# Patient Record
Sex: Female | Born: 1975 | Race: Black or African American | Hispanic: No | Smoking: Current every day smoker
Health system: Southern US, Community
[De-identification: ages and names within clinical notes are randomized; demographics above are authoritative.]

## PROBLEM LIST (undated history)

## (undated) DIAGNOSIS — G43909 Migraine, unspecified, not intractable, without status migrainosus: Secondary | ICD-10-CM

## (undated) DIAGNOSIS — I10 Essential (primary) hypertension: Secondary | ICD-10-CM

## (undated) HISTORY — PX: CHOLECYSTECTOMY: SHX55

## (undated) HISTORY — PX: ABDOMINAL HYSTERECTOMY: SHX81

## (undated) HISTORY — PX: BACK SURGERY: SHX140

## (undated) HISTORY — PX: KNEE SURGERY: SHX244

---

## 2000-06-09 ENCOUNTER — Ambulatory Visit (HOSPITAL_COMMUNITY): Admission: RE | Admit: 2000-06-09 | Discharge: 2000-06-09 | Payer: Self-pay | Admitting: General Surgery

## 2002-02-15 ENCOUNTER — Inpatient Hospital Stay (HOSPITAL_COMMUNITY): Admission: RE | Admit: 2002-02-15 | Discharge: 2002-02-18 | Payer: Self-pay | Admitting: Orthopaedic Surgery

## 2002-02-15 ENCOUNTER — Encounter: Payer: Self-pay | Admitting: Orthopaedic Surgery

## 2005-12-26 LAB — CONVERTED CEMR LAB: Pap Smear: NORMAL

## 2006-04-29 ENCOUNTER — Ambulatory Visit: Payer: Self-pay | Admitting: Internal Medicine

## 2006-05-06 ENCOUNTER — Ambulatory Visit (HOSPITAL_COMMUNITY): Admission: RE | Admit: 2006-05-06 | Discharge: 2006-05-06 | Payer: Self-pay | Admitting: Internal Medicine

## 2006-05-06 ENCOUNTER — Ambulatory Visit: Payer: Self-pay | Admitting: Internal Medicine

## 2006-05-06 ENCOUNTER — Encounter (INDEPENDENT_AMBULATORY_CARE_PROVIDER_SITE_OTHER): Payer: Self-pay | Admitting: Specialist

## 2006-07-15 ENCOUNTER — Ambulatory Visit: Payer: Self-pay | Admitting: Gastroenterology

## 2007-08-17 ENCOUNTER — Encounter: Admission: RE | Admit: 2007-08-17 | Discharge: 2007-08-17 | Payer: Self-pay | Admitting: Orthopaedic Surgery

## 2007-12-05 ENCOUNTER — Ambulatory Visit: Payer: Self-pay | Admitting: Internal Medicine

## 2007-12-05 DIAGNOSIS — N390 Urinary tract infection, site not specified: Secondary | ICD-10-CM | POA: Insufficient documentation

## 2007-12-05 DIAGNOSIS — K219 Gastro-esophageal reflux disease without esophagitis: Secondary | ICD-10-CM | POA: Insufficient documentation

## 2007-12-05 DIAGNOSIS — M545 Low back pain, unspecified: Secondary | ICD-10-CM | POA: Insufficient documentation

## 2007-12-05 DIAGNOSIS — K5900 Constipation, unspecified: Secondary | ICD-10-CM | POA: Insufficient documentation

## 2007-12-05 DIAGNOSIS — K589 Irritable bowel syndrome without diarrhea: Secondary | ICD-10-CM | POA: Insufficient documentation

## 2007-12-05 DIAGNOSIS — Z87898 Personal history of other specified conditions: Secondary | ICD-10-CM | POA: Insufficient documentation

## 2007-12-05 DIAGNOSIS — F17200 Nicotine dependence, unspecified, uncomplicated: Secondary | ICD-10-CM | POA: Insufficient documentation

## 2007-12-05 DIAGNOSIS — J309 Allergic rhinitis, unspecified: Secondary | ICD-10-CM | POA: Insufficient documentation

## 2007-12-05 DIAGNOSIS — Z8669 Personal history of other diseases of the nervous system and sense organs: Secondary | ICD-10-CM | POA: Insufficient documentation

## 2007-12-05 DIAGNOSIS — D649 Anemia, unspecified: Secondary | ICD-10-CM | POA: Insufficient documentation

## 2007-12-06 LAB — CONVERTED CEMR LAB
Eosinophils Relative: 2 % (ref 0–5)
Glucose, Bld: 76 mg/dL (ref 70–99)
HCT: 37.7 % (ref 36.0–46.0)
Hemoglobin: 12.1 g/dL (ref 12.0–15.0)
Lymphocytes Relative: 35 % (ref 12–46)
Lymphs Abs: 1.6 10*3/uL (ref 0.7–4.0)
Monocytes Absolute: 0.5 10*3/uL (ref 0.1–1.0)
RBC: 4.16 M/uL (ref 3.87–5.11)
Total CHOL/HDL Ratio: 2.6
VLDL: 9 mg/dL (ref 0–40)
WBC: 4.4 10*3/uL (ref 4.0–10.5)

## 2007-12-20 ENCOUNTER — Telehealth (INDEPENDENT_AMBULATORY_CARE_PROVIDER_SITE_OTHER): Payer: Self-pay | Admitting: *Deleted

## 2008-02-03 ENCOUNTER — Ambulatory Visit: Payer: Self-pay | Admitting: Internal Medicine

## 2008-02-03 DIAGNOSIS — R51 Headache: Secondary | ICD-10-CM | POA: Insufficient documentation

## 2008-02-03 DIAGNOSIS — R519 Headache, unspecified: Secondary | ICD-10-CM | POA: Insufficient documentation

## 2008-03-09 ENCOUNTER — Ambulatory Visit: Payer: Self-pay | Admitting: Internal Medicine

## 2008-03-14 ENCOUNTER — Ambulatory Visit (HOSPITAL_COMMUNITY): Admission: RE | Admit: 2008-03-14 | Discharge: 2008-03-14 | Payer: Self-pay | Admitting: Internal Medicine

## 2008-04-06 ENCOUNTER — Ambulatory Visit: Payer: Self-pay | Admitting: Internal Medicine

## 2008-05-18 ENCOUNTER — Ambulatory Visit: Payer: Self-pay | Admitting: Internal Medicine

## 2008-06-21 ENCOUNTER — Ambulatory Visit: Payer: Self-pay | Admitting: Internal Medicine

## 2008-06-21 DIAGNOSIS — K645 Perianal venous thrombosis: Secondary | ICD-10-CM | POA: Insufficient documentation

## 2008-08-21 ENCOUNTER — Telehealth (INDEPENDENT_AMBULATORY_CARE_PROVIDER_SITE_OTHER): Payer: Self-pay | Admitting: Family Medicine

## 2008-08-21 ENCOUNTER — Encounter (INDEPENDENT_AMBULATORY_CARE_PROVIDER_SITE_OTHER): Payer: Self-pay | Admitting: Internal Medicine

## 2009-01-30 DIAGNOSIS — R1310 Dysphagia, unspecified: Secondary | ICD-10-CM | POA: Insufficient documentation

## 2009-01-31 ENCOUNTER — Ambulatory Visit: Payer: Self-pay | Admitting: Internal Medicine

## 2009-02-01 ENCOUNTER — Encounter: Payer: Self-pay | Admitting: Internal Medicine

## 2009-02-04 ENCOUNTER — Encounter: Payer: Self-pay | Admitting: Internal Medicine

## 2009-02-22 ENCOUNTER — Ambulatory Visit (HOSPITAL_COMMUNITY): Admission: RE | Admit: 2009-02-22 | Discharge: 2009-02-22 | Payer: Self-pay | Admitting: Internal Medicine

## 2009-02-22 ENCOUNTER — Ambulatory Visit: Payer: Self-pay | Admitting: Internal Medicine

## 2009-03-09 ENCOUNTER — Encounter: Payer: Self-pay | Admitting: Internal Medicine

## 2009-11-15 ENCOUNTER — Emergency Department (HOSPITAL_COMMUNITY): Admission: EM | Admit: 2009-11-15 | Discharge: 2009-11-15 | Payer: Self-pay | Admitting: Emergency Medicine

## 2009-11-28 ENCOUNTER — Encounter: Admission: RE | Admit: 2009-11-28 | Discharge: 2009-11-28 | Payer: Self-pay | Admitting: Orthopaedic Surgery

## 2010-02-11 NOTE — Letter (Signed)
Summary: [facility]  [facility]   Imported By: Diana Eves 02/01/2009 16:26:21  _____________________________________________________________________  External Attachment:    Type:   Image     Comment:   External Document

## 2010-02-11 NOTE — Assessment & Plan Note (Signed)
Summary: having trouble with acid reflux- cdg   Visit Type:  Follow-up Visit Primary Care Provider:  fusco  Chief Complaint:  reflux.  History of Present Illness: 35 year old lady referred by [facility] for reevaluation refractory GERD symptoms previously; he has a long-standing history of GERD. I saw her back in 2008.  She underwent an EGD which revealed a normal-appearing tubular esophagus/ small hiatal hernia. There were subtle abnormalities of the duodenum biopsies demonstrated hyperemia but no other abnormalities. She had done well previously on Zegerid 40 mg orally daily. She came off his medication along the wayand was tried on Nexium. This does not work nearly as well for this nice lady. She was having dysphagia back in 2008. I passed a 17 [place] Maloney dilator. This was associated with resolution of dysphagia until the past couple months when this symptom has recurred. She smokes a half-pack surgery/ 3 days. No alcohol. No melena.  She has lost some weight trying to become more healthy.  She lost 28 pounds since last being seen in 2008. No family history of colon cancer in anay  first or second-degree relatives or any other GI malignancies for that matter.  Problems Prior to Update: 1)  Dysphagia Unspecified  (ICD-787.20) 2)  Hemorrhoid, External, Thrombosed  (ICD-455.4) 3)  Headache  (ICD-784.0) 4)  Tobacco Abuse  (ICD-305.1) 5)  Preventive Health Care  (ICD-V70.0) 6)  Carpal Tunnel Syndrome, Hx of  (ICD-V12.49) 7)  Migraines, Hx of  (ICD-V13.8) 8)  Uti's, Recurrent  (ICD-599.0) 9)  Constipation  (ICD-564.00) 10)  Ibs  (ICD-564.1) 11)  Low Back Pain  (ICD-724.2) 12)  Gerd  (ICD-530.81) 13)  Anemia-nos  (ICD-285.9) 14)  Allergic Rhinitis  (ICD-477.9)  Current Problems (verified): 1)  Dysphagia Unspecified  (ICD-787.20) 2)  Hemorrhoid, External, Thrombosed  (ICD-455.4) 3)  Headache  (ICD-784.0) 4)  Tobacco Abuse  (ICD-305.1) 5)  Preventive Health Care   (ICD-V70.0) 6)  Carpal Tunnel Syndrome, Hx of  (ICD-V12.49) 7)  Migraines, Hx of  (ICD-V13.8) 8)  Uti's, Recurrent  (ICD-599.0) 9)  Constipation  (ICD-564.00) 10)  Ibs  (ICD-564.1) 11)  Low Back Pain  (ICD-724.2) 12)  Gerd  (ICD-530.81) 13)  Anemia-nos  (ICD-285.9) 14)  Allergic Rhinitis  (ICD-477.9)  Current Medications (verified): 1)  Topamax 50 Mg Tabs (Topiramate) .Marland Kitchen.. 1 By Mouth At Bedtime  Allergies (verified): No Known Drug Allergies  Past History:  Past Medical History: Last updated: 12/05/2007 Allergic rhinitis Anemia-NOS Anxiety Depression GERD Low back pain--Dr. Launa Grill Pain IBS ith constipation UTI's, recurrent migraines, hx carpal tunnel syndrome  Past Surgical History: Last updated: 12/05/2007 Cholecystectomy-2000 Hysterectomy-2002-left ovary-- menorrhagia right knee-1992-arthroscopy s/p basketball injury back surgery-2004-fusion--Dr. Noel Gerold EGD with empiric esophageal dilation--4/08  Family History: Last updated: 12/05/2007 father-65-HTN mother-59-HTN, hyperlipidemia, asthma sister-39- brother-38 son-16-asthma daughter-8-asthma, recurrent UTI's  MGM and PGM deceased from stroke  Social History: Last updated: 05/18/2008 Married lives witht children and parents Current Smoker-1pack q 3 days--previous 1ppd x3 years Alcohol use-no Drug use-no  Risk Factors: Smoking Status: current (12/05/2007)  Vital Signs:  Patient profile:   35 year old female Height:      64 inches Weight:      189.5 pounds BMI:     32.65 Temp:     98.0 degrees F oral Pulse rate:   80 / minute BP sitting:   118 / 88  (right arm)  Physical Exam  General:  pleasant alert conversant lady in no acute distress. She is accompanied by her fiance Eyes:  no scleral icterus. Conjunctivae were pink Lungs:  clear to auscultation Heart:  regular rate and rhythm without murmur gallop or Abdomen:  nondistended positive bowel sounds soft and nontender without  appreciable mass or organomegaly Extremities:  no edema  Impression & Recommendations: Impression: Long-standing gastroesophageal reflux disease symptoms more recently refractory to Nexium. She had a nice response with Zegerid previously. She now reports recurrent esophageal dysphagia to solids.  Recommendations: Diagnostic EGD with esophageal dilation as appropriate in the near future. Risks, benefits, alternatives, limitations reviewed with this nice lady. Her questions have been answered. She is agreeable. We'll make further recommendations regarding management after endoscopic evaluation is taken place.Marland Kitchen  Appended Document: Orders Update-charge    Clinical Lists Changes  Orders: Added new Service order of Consultation Level IV [id number]) - Signed

## 2010-02-11 NOTE — Letter (Signed)
Summary: EGD ORDER  EGD ORDER   Imported By: Diana Eves 02/04/2009 14:25:44  _____________________________________________________________________  External Attachment:    Type:   Image     Comment:   External Document

## 2010-02-11 NOTE — Letter (Signed)
Summary: Patient Notice, Endo Biopsy Results  [facility]  [address]   [place], [place] [address]   Phone: [phone]  Fax: [phone]       March 09, 2009   [name] [address] [place], [place]  [address] 1975-11-12    Dear Ms. [name],  I am pleased to inform you that the biopsies taken during your recent endoscopic examination did not show any evidence of cancer upon pathologic examination. They did show only mild inflammation.  Additional information/recommendations:  Continue with the treatment plan as outlined on the day of your exam.  Please call us if you are having persistent problems or have questions about your condition that have not been fully answered at this time.  Sincerely,    R. Roetta Sessions MD  [facility] Ph: [phone]   Fax: [phone]   Appended Document: Patient Notice, Endo Biopsy Results mailed letter to pt

## 2010-05-27 NOTE — Assessment & Plan Note (Signed)
[name], [name]                CHART#:  [id number]   DATE:  07/15/2006                       DOB:  10/10/1975   CHIEF COMPLAINT:  Followup gastroesophageal reflux disease.   SUBJECTIVE:  The patient is a 35 year old female last seen by Dr.  Jena Gauss  when she underwent EGD for esophageal dysphagia and refractory GERD  symptoms on May 06, 2006. She was found to have a normal esophagus,  which was empirically dilated with a 56 [place] Maloney dilator. She also  had a small hiatal hernia. Her duodenal mucosa was somewhat abnormal;  biopsy consistent with hyperemia. She was started on Zegerid. However,  she says that her co-pay is too expensive. She has been getting samples.  This does seem to eradicate her heartburn and indigestion. Her main  concern today is post-prandial diarrhea. This is not with every meal. It  is generally only when she goes out to eat. She tells me within a few  minutes she has to run to the bathroom and have up to three bowel  movements a day. This can alternate with constipation where she can go a  couple of days without a bowel movement. She denies any rectal bleeding,  melena or mucus in her stools. She tells me her weight is steadily  increasing. She has always noticed problems with dairy products  throughout her lifetime and tries to avoid them.   SHE HAS FAILED PRILOSEC 20 MG AFTER A THREE MONTH TRIAL.   CURRENT MEDICATIONS:  See the list from July 15, 2006.   ALLERGIES:  No known drug allergies.   OBJECTIVE:  VITAL SIGNS: Weight is 216 pounds. Height 65 inches,  temperature 98.8, blood pressure 128/80, pulse 76.  GENERAL: The patient is a well-developed, well-nourished female in no  acute distress.  HEENT: Sclera clear, nonicteric, conjunctivae pink. Oropharynx is pink,  moist without any lesions.  CHEST: With regular rate and rhythm with normal S1, S2 without murmurs,  rubs or gallops.  ABDOMEN: Positive bowel sounds x4. No bruits auscultated.  Soft,  nontender, nondistended and without palpable mass or hepatosplenomegaly.  No rebound tenderness or guarding.  EXTREMITIES: Without clubbing or edema bilaterally.   ASSESSMENT:  The patient is a 35 year old female with chronic  gastroesophageal reflux disease well-controlled on Zegerid. However,  cost is an issue. We will attempt to give her samples as well as a  rebate card and she is going to contact her insurance company to see  whether there would be a cheaper PPI covered.   Her post-prandial diarrhea alternatin with constipation is most likely  related to irritable bowel syndrome. She also seems to have an element  of lactose intolerance per her history.   PLAN:  1. Would attempt a 2 week trial of lactose free diet.  2. Bentyl 10 mg AC and h.s. p.r.n. diarrhea #60 with one refill.  3. Irritable bowel syndrome literature given for her review.  4. Zegerid 40 mg daily samples for two weeks as well as a rebate were      given.  5. Followup in three months with Dr.  Jena Gauss.       Lorenza Burton, N.P.  Electronically Signed     KJ/MEDQ  D:  07/15/2006  T:  07/15/2006  Job:  [id number]   cc:   Kirstie Peri, MD

## 2010-05-30 NOTE — Op Note (Signed)
[name], [name]                           ACCOUNT NO.:  [id number]   MEDICAL RECORD NO.:  [id number]                   PATIENT TYPE:  INP   LOCATION:  2899                                 FACILITY:  [facility]   PHYSICIAN:  Sharolyn Douglas, M.D.                     DATE OF BIRTH:  1975-11-29   DATE OF PROCEDURE:  02/15/2002  DATE OF DISCHARGE:                                 OPERATIVE REPORT   DIAGNOSIS:  L5-S1 ischemic spondylolisthesis with back pain and bilateral L5  radiculopathy, right greater than left.   PROCEDURE:  1. L5-S1 Gill laminectomy with decompression of the common dural sac and L5     nerve roots bilaterally.  2. Posterior spinal arthrodesis, L5-S1.  3. Pedicle screw instrumentation, L5-S1.  4. Left posterior iliac crest bone graft, morselized.  5. Neurological monitoring utilizing trigger and free run EMGs.   SURGEON:  Dr. Sharolyn Douglas.   ASSISTANT:  Verlin Fester, P.A.   ANESTHESIA:  General endotracheal.   COMPLICATIONS:  None.   INDICATIONS FOR PROCEDURE:  This patient is a 35 year old female with a long  history of progressively worsening back and bilateral lower extremity pain  consistent with an L5 radiculopathy.  Plain x-ray had shown isthmic  spondylolisthesis, Grade 1 at L5-S1.  MRI scan shows foraminal narrowing at  L5-S1.  Because of her persistent symptoms unresponsive to conservative  care, she elected to undergo posterior spinal fusion and decompression in  hopes of improving her symptomatology.   DESCRIPTION OF PROCEDURE:  The patient was properly identified in the  holding area and taken to the operating room.  She underwent general  endotracheal anesthesia without difficulty.  She was given prophylactic IV  antibiotics.  She was carefully turned prone to the [employer] four poster  positioning frame.  All bony prominences were padded.  The face and eyes  were protected at all times.  Back was prepped and draped in the usual  sterile fashion.  The  skin and subcutaneous tissue were infiltrated with 20  cc of 1% Lidocaine with epinephrine for hemostasis.  An 8 cm midline  incision was made from L4 to the sacrum.  Dissection was carried out at the  deep fascia.  Deep fascia was incised and paraspinal muscles subperiosteally  stripped out to the tips of the transverse processes of L5 as well as the  sacral ala.  We immediately encountered the loose L5 spinous process  representing the _____ fragment.  We took an intraoperative x-ray confirming  that this was, in fact, the L5 level.  We cleaned the L5-S1 joints of all  soft tissue.  We then removed the ____ fragment as one single piece by  carefully dissecting the ligamentum flavum and facet joint capsules.  We  then performed radical foraminotomies bilaterally decompressing the L5 nerve  roots.  At this point, we turned our attention to  placing pedicle screws at  L5 and S1, using anatomic probing technique, each pedicle starting point was  identified.  The awl was used to enter the pedicle.  The pedicle probe was  then used to cannulate the pedicle and then a ball tipped feeler was used to  confirm there were no breaches.  Each pedicle was then tapped and the screw  placed.  We placed 45 x 5 screw on the left at L5, and a 35 x 5.5 mm screw  on the right.  We placed 40 x 6.5 mm screws bilaterally in the sacrum.  We  used intraoperative fl uroscopy to confirm acceptable positioning of the  screws.  We also palpated the pedicles from within the canal and there were  no breaches.  We then placed our rods and locked the caps into place.  We  turned our attention to collecting bone graft in the left posterior iliac  crest.  Using a separate fascial incision, a small incision was made to  expose the posterior superior iliac spine.  We removed the cap of the PSIS  and then used curets to remove cancellous bones from between the iliac  tables.  The fascia was closed with running #1 Vicryl.  We  then used a high  speed bur to decorticate the L5 transverse process as well as sacral ala  bilaterally.  We packed the posterior iliac crest bone graft tightly into  the lateral gutters between the transverse process and ala.  On top of this,  we placed local autogenous bone that had been collected from the [facility]  laminectomy.  5 cc of DBX DBM was then placed on top of the autogenous bone.  We again evaluated the L5 nerve roots and found them to be free from their  take off all the way out the foramen.  The wound was irrigated.  The Hemovac  was placed.  The deep fascia was closed with a running #1 Vicryl,  subcutaneous layer closed with interrupted 2-0 Vicryl followed by a running  4-0 subcuticular Vicryl suture.  Benzoin and Steri-Strips were placed.  It  should be noted that free running EMGs were monitored throughout the  procedure, there were no deleterious changes.  Each pedicle screw was tested  using triggered EMGs and we had a response at greater than 20 milliamps,  consistent with interosseous placement of the screws.  The patient was  extubated without difficulty, transferred to the recovery room in stable  condition, able to move her upper and lower extremities.                                               Sharolyn Douglas, M.D.    MC/MEDQ  D:  02/15/2002  T:  02/16/2002  Job:  [id number]

## 2010-05-30 NOTE — Consult Note (Signed)
NAMELELANI, [name]                ACCOUNT NO.:  [id number]   MEDICAL RECORD NO.:  [id number]          PATIENT TYPE:  AMB   LOCATION:                                FACILITY:  [facility]   PHYSICIAN:  R. Roetta Sessions, M.D. DATE OF BIRTH:  [birth year]   DATE OF CONSULTATION:  04/29/2006  DATE OF DISCHARGE:                                 CONSULTATION   REASON FOR CONSULTATION:  Dysphasia and GERD.   HISTORY OF PRESENT ILLNESS:  Patient is a pleasant 35 year old African-  American female, who was sent over at the courtesy of Dr. Sherryll Burger to  further evaluate.  She has had a several year history of dysplasia.  She  describes things that she swallows thickened behind her breast bone for  10 to 15 seconds.  This can be liquids, solid food, and pills.  She also  describes worsening of reflux symptoms; she describes as heartburn.  She  is taken various agents intermittently over time, and started on  Prilosec 20 mg orally b.i.d. one week ago without much difference in the  symptoms.  She is gained 20 pounds over the past one year.  She also has  become constipated over the last three months, having won bowel movement  every three days.  She is not had any female in our rectal bleeding.  She  does not have any true odynophagia.  There is no early satiety, nausea  or vomiting.  She denies abdominal pain.  She does smoke a half pack of  cigarettes per day and has two to three mixed-drink beverages twice  monthly.  Her gallbladder is out.  She is never had her upper GI tract  evaluated.   PAST MEDICAL HISTORY:  Remarkable for the above mentioned symptoms.   PAST SURGICAL HISTORY:  1. Hysterectomy.  2. Cholecystectomy.  3. Back surgery.  4. Knee surgery.   CURRENT MEDICATIONS:  1. Prilosec 20 mg orally b.i.d.  2. Allegra 180 mg daily.  3. Nasacort daily.   ALLERGIES:  No known drug allergies.   FAMILY HISTORY:  Mother is alive with hypercholesterolemia.  Father is  alive at diabetes.  She tells  me that two of her maternal uncles with  diagnosed with colon cancer in their 5s or 7s.   SOCIAL HISTORY:  The patient is separated and has two children.  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  Again,  she smokes a half pack of cigarettes daily; and consumes alcohol twice  monthly, as described above.   REVIEW OF SYSTEMS:  No chest pain, no dyspnea on exertion.  No fever or  chills.  Weight gain as outlined above.   PHYSICAL EXAMINATION:  GENERAL:  Reveals a pleasant, 35 year old lady  resting comfortably.  VITAL SIGNS:  Weight 212.  Height 5 feet 4 inches.  Temperature 99.3, BP  118/88, pulse 84.  SKIN:  Warm and dry.  HEENT:  No scleral icterus.  Conjunctivae pink.  Oral cavity no lesions.  NECK:  JVD is not prominent.  CHEST:  Lungs are clear to auscultation.  CARDIAC EXAM:  Regular rate and rhythm without murmur, gallop, or rub.  ABDOMEN:  Nondistended, positive bowel sounds.  Soft and nontender,  without appreciable mass or organomegaly.  EXTREMITIES:  No edema.   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  This is in a setting  of progressive gastroesophageal reflux that she describes as heartburn.  She has had little relief with intermittent use of PPI therapy.  She  also is constipated.  I wonder if she is hypothyroid.   RECOMMENDATIONS:  I have offered her an EGD with possible esophageal  dilation.  The potential risks, benefits, and alternatives have been  reviewed.  We talked about a Barium pill esophagogram.  I think that the  most expeditious approach would be to go ahead and proceed with an upper  endoscopy.  I have also asked her to try some MiraLax 17 gm orally at  bedtime, nightly, to see how this works for her constipation.  We will  check a TSH and Chem-20 today.  Her questions were answered, and she is  agreeable to EGD.  We will also plan to do three  Hemoccults at some  point here in the near future.   Further recommendations to follow.   I would like to thank Dr. Kirstie Peri for allowing me to see this very  nice lady today.      [name], M.D.  Electronically Signed     RMR/MEDQ  D:  04/29/2006  T:  04/29/2006  Job:  [id number]   cc:   Kirstie Peri, MD  Fax: [phone]

## 2010-05-30 NOTE — Discharge Summary (Signed)
NAMEGLORIANN, [name]                           ACCOUNT NO.:  [id number]   MEDICAL RECORD NO.:  [id number]                   PATIENT TYPE:  INP   LOCATION:  5031                                 FACILITY:  [facility]   PHYSICIAN:  Sharolyn Douglas, M.D.                     DATE OF BIRTH:  [birth year]   DATE OF ADMISSION:  02/15/2002  DATE OF DISCHARGE:  02/18/2002                                 DISCHARGE SUMMARY   ADMISSION DIAGNOSES:  1. L5-S1 spondylolisthesis.  2. Anxiety.  3. Reflux.   DISCHARGE DIAGNOSES:  1. Status post L5-S1 posterior spinal fusion doing well.  2. Postoperative hemorrhagic anemia that did not require blood transfusion.  3. Low grade temperature postoperatively that resolved with incentive     spirometer use.  4. Anxiety.  5. Reflux.   PROCEDURE:  L5-S1 posterior spinal fusion with total excision and total  laminectomy.   CONSULTATIONS:  None.   HISTORY OF PRESENT ILLNESS:  The patient is a 35 year old female with back  and bilateral lower extremity pain. Also weakness.  She was found to have a  spondylolisthesis involving weakness of the bilateral lower extremities.  Pain is severe and she has pain in the bilateral L5 distribution. Right  lower extremity is worse than the left.  She does have problems with falling  and poor balance. She does fall and tends to lean to the right side. She has  difficulty walking up and down steps. Her mother does report she is walking  with a waddling gait. She tried numerous conservative management including  anti-inflammatories, pain medications, activity modification, and also  physical therapy to no avail.  She reports numbness and tingling in her  legs.  No bowel or bladder dysfunction.  Risks and benefits of post surgery  were discussed with the patient by Dr. Noel Gerold and she presented good  understanding and opted to proceed.   LABORATORY DATA:  CBC with differential on February 15, 2002, was within  normal limits with the  exception of a red blood cell count of 3.66,  hemoglobin 10.8, and hematocrit of 31.8, RDW 14.9.  H&H were monitored  postoperatively and reached a low of 9.3 and 27.2, respectively on February 16, 2002.  She was asymptomatic.  After transfusion she increased up to 10.5  and 30.8 by discharge.  PT, INR, and PTT normal on admission.  Complete  metabolic panel on admission normal.  She did have BMET monitored two days  postoperatively. She did have mild hypokalemia at 2.2 and this responded and  improved by discharge to 3.8.  Glucose slightly elevated on February 4, at  123 and BUN slightly decreased on February 16, 2002.  UA on admission was  normal with the exception of a hazy appearance.  No bacteria seen.  ________  screen on February 15, 2002, showed ________ positive, antibody screen  negative.  X-ray on February 15, 2002, showed a posterior spinal fusion at L5-S1.   HOSPITAL COURSE:  Following admission on February 15, 2002, the patient was  taken to the operating suite for the above listed procedure.  She tolerated  the procedure very well without any complications.  The estimated blood loss  was 250 mL. There was one Hemovac drain placed. She was transferred to the  recovery room in stable condition.  Postoperatively, her antibiotic course  was completed without difficulty.  Neurovascular checks were started upon  admission to the hospital and continued throughout the stay without any  difficulty.  Incentive spirometer was utilized to decreased the chance of  any pulmonary complications.  CTs and TED hose were utilized to decrease the  chance of any DVTs as well as early mobilization.  The patient's pain  control was obtained using a combination of PCA as well as p.o. analgesics.  She was turned strictly over to p.o. by postoperative day #2 and pain  control has remained adequate.  Physical therapy and occupational therapy  were consulted to assess the patient with progressive  ambulation and safety  issues. She did well with them, getting to the point by postoperative day #3  that she was a safe and independent ambulator.  Diet was slowly advanced.  She was kept NPO until she passed flatus at which time her diet was slowly  advanced to a regular home diet without any difficulty or complications.  On  postoperative day #2, the patient did develop a low grade temperature and  this resolved prior to discharge by use of incentive spirometer as well as  coughing and deep breathing.  Hemovac drain was discontinued on  postoperative day #2 as well. It was intact without any problems.  Dressing  changes were started on postoperative day #2 and continued through the  hospital stay.  Incision looked good and continued to look good throughout  the hospital stay without any signs or symptoms of infection.   As previously dictated, her hemoglobin did reach a low of 9.3 on  postoperative day #1, however, she was asymptomatic and did not require a  blood transfusion.  By postoperative day #3, on February 18, 2002, the  patient had met all orthopedic goals. She was safe ambulating independently.  She was medically stable and ready for discharge.  Vital signs were stable.  She was neurovascularly intact.  Motor and sensation were intact and  unchanged.  Incision looked good without any signs or symptoms of infection.  Homan's was negative.  Abdomen is soft without any tenderness or distention.   PLAN:  The patient is a 35 year old female status post L5-S1 posterior  spinal fusion doing very well.   ACTIVITY:  Weightbearing as tolerated, progressive ambulation program. Back  precautions were discussed and understood.  No lifting greater than 5  pounds. Dressing changes on a daily basis.  Brace on when up.  May shower on  postoperative day #5 if there is no drainage from the incision.   FOLLOW UP:  In 10 to 14 days with Sharolyn Douglas, M.D. and call for an appointment.   DIET:   Regular home diet as tolerated.   CONDITION ON DISCHARGE:  Stable and improved.   DISPOSITION:  The patient is being discharged to her home with her families'  assistance.     Colleen Mahar, P.A.  Sharolyn Douglas, M.D.    CM/MEDQ  D:  03/23/2002  T:  03/24/2002  Job:  [id number]

## 2010-05-30 NOTE — Op Note (Signed)
[name], [name]               ACCOUNT NO.:  [id number]   MEDICAL RECORD NO.:  [id number]          PATIENT TYPE:  AMB   LOCATION:  DAY                           FACILITY:  [facility]   PHYSICIAN:  R. Roetta Sessions, M.D. DATE OF BIRTH:  1975/03/04   DATE OF PROCEDURE:  05/06/2006  DATE OF DISCHARGE:                               OPERATIVE REPORT   PROCEDURE:  Esophagogastroduodenoscopy with Elease Hashimoto dilation and biopsy.   INDICATIONS FOR PROCEDURE:  30-year lady with chronic esophageal  dysphagia to both liquids and solids. This is in the setting of  progressive esophageal reflux disease symptoms she describes as  heartburn, little relief with Prilosec 20 mg orally b.i.d.  She is  somewhat constipated, as well.  We have started her on some MiraLax  through the office.  Her TSH and calcium were found to be normal of  1.444 and 9.2, respectively, recently through my office on April 30, 2006.  EGD is now being done, the potential for esophageal dilation has  been reviewed.   PROCEDURE NOTE:  O2 saturation, blood pressure, pulse oximetry, and  respirations were monitored throughout the entire procedure.  Conscious  sedation with Versed 8 mg IV and Demerol 125 mg IV in divided doses.  Cetacaine spray for topical pharyngeal anesthesia.  Instrument Pentax  video chip system.   FINDINGS:  Examination of the tubular esophagus revealed no mucosal  abnormalities.  The EG junction was easily traversed.  On entering the  stomach, the gastric cavity was empty and insufflated well with air.  Thorough examination of the gastric mucosa including retroflex view of  the proximal stomach and esophagogastric junction demonstrated only a  small hiatal hernia.  The pylorus was patent and easily traversed.  Examination of the bulb and second portion revealed an unusual linear  area of adenomatous appearing mucosa extending down from the ampulla of  Vater.  Please see photos.  This did not appear to be  neoplastic but did  appear atypical, please see photos.   THERAPEUTIC/DIAGNOSTIC MANEUVERS:  A 56-French Maloney dilator was  passed to full insertion, look back revealed no apparent complication  related to passage of the dilator.  Subsequently, the D2 mucosa in  question was biopsied for histologic study.  The patient tolerated the  procedure well and was reacted in endoscopy.   IMPRESSION:  Normal appearing esophagus status post passage of a 68-  [place] Maloney dilator, small hiatal hernia, otherwise, normal stomach.  Patent pylorus. Somewhat abnormal second portion duodenal mucosa, as  described above, biopsied.   RECOMMENDATIONS:  1. Stop Prilosec, begin AcipHex 20 mg daily.  2. Continue MiraLax previously prescribed.  3. Follow up on path.  4. Follow up appointment with [place] in one month.      Jonathon Bellows, M.D.  Electronically Signed     RMR/MEDQ  D:  05/06/2006  T:  05/06/2006  Job:  [id number]   cc:   Dr. Sherryll Burger

## 2010-05-30 NOTE — H&P (Signed)
[name], [name]                           ACCOUNT NO.:  [id number]   MEDICAL RECORD NO.:  [id number]                   PATIENT TYPE:  INP   LOCATION:  2899                                 FACILITY:  [facility]   PHYSICIAN:  Sharolyn Douglas, M.D.                     DATE OF BIRTH:  1975/08/31   DATE OF ADMISSION:  02/15/2002  DATE OF DISCHARGE:                                HISTORY & PHYSICAL   CHIEF COMPLAINT:  Back and bilateral lower extremity pain and weakness.   HISTORY OF PRESENT ILLNESS:  The patient is a 35 year old female with back  and bilateral lower extremity pain for some time now. She also complains of  weakness in bilateral lower extremities.  The pain is severe, and she has  pain in bilateral L5 distribution, right lower extremity worse than left.  She continues to have problems with falling and poor balance.  She does  fall, tends to lean to right side.  She does have difficult time with steps.  Her mom reports she is walking with a wandering gait.  She had tried  numerous attempts at conservative management including Celebrex, Ultracet,  Robaxin, Percocet, activity modification, exercises to no avail. She reports  numbness and tingling in her legs, right greater than left.  She has no  bladder or bowel dysfunction.  Risks and benefits of the proposed surgery  were discussed with the patient by Dr. Sharolyn Douglas.  She indicated  understanding and opted to proceed.   ALLERGIES:  No known drug allergies.   MEDICATIONS:  1. Xanax 0.25 mg p.o. p.r.n.  2. Ultracet, Robaxin, and Celebrex.   PAST MEDICAL HISTORY:  Anxiety and gastroesophageal reflux disease.   PAST SURGICAL HISTORY:  1. Right knee arthroscopy in 1991.  2. Cholecystectomy in 2002.  3. C section in 2001.   SOCIAL HISTORY:  The patient denies tobacco use, drinks alcohol on rare  social basis.  She is married. 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  She has two children.  Her mother and her  husband will be  available to help with postoperative course.   FAMILY MEDICAL HISTORY:  Mother is alive at age 73 with coronary artery  disease and history of cardiac palpitations.  Father is alive at age 41 with  hypertension, diabetes.   REVIEW OF SYSTEMS:  The patient denies any fevers, chills, sweats, or  bleeding tendencies.  CNS: Denies blurred vision, double vision, seizures,  headaches, paralysis.  CARDIOVASCULAR:  Denies chest pain, angina,  orthopnea, palpitations. PULMONARY:  Denies shortness of breath, productive  cough, or hemoptysis.  GI: Denies nausea, vomiting, constipation, diarrhea,  melena, bloody stool.  GU:  Denies dysuria, hematuria, discharge.  MUSCULOSKELETAL:  As per HPI.   PHYSICAL EXAMINATION:  VITAL SIGNS:  Blood pressure 110/80, respirations 60  and unlabored, pulse 82 and regular.  GENERAL:  The patient is a 35 year old black female, alert and oriented, in  no acute distress.  The patient appears stated age, pleasant and cooperative  to examination.  HEENT:  Head is normocephalic and atraumatic.  Pupils are equal, round, and  reactive to light.  Extraocular movements intact.  Nares patent, pharynx  clear.  NECK:  Soft to palpation.  No bruits appreciated.  No lymphadenopathy or  thyromegaly noted.  CHEST: Clear to auscultation bilaterally.  No rales, rhonchi, wheeze.  BREASTS:  Not pertinent, not performed.  HEART:  S1, S2, regular rate and rhythm.  No murmurs, gallops, or rubs  noted.  ABDOMEN:  Positive bowel sounds.  Nontender, nondistended.  No organomegaly  noted.  GU:  Not pertinent, not performed.  EXTREMITIES:  Neurovascularly intact.  Motor and sensation grossly intact.  She has pain with extension in back and down both legs.  She walks with  waddling gait.  Decreased sensation at L5 distribution.  Giveaway weakness  in quadriceps and tibialis anterior bilaterally.  Straight leg raise is  negative bilaterally.  Hoffmann's negative.  SKIN:   Intact without lesion or rash.   LABORATORY DATA:  X-rays show spondylolisthesis at L5-S1.   IMPRESSION:  1. L5-S1 spondylolisthesis.  2. Anxiety.   PLAN:  Admit to [facility] on 02/15/2002 to undergo posterior spinal  fusion L5-S1 this weekend by Sharolyn Douglas, M.D.  The patient has donated 2  units of packed red blood cells.  The patient has a brace for her  postoperative course.  The patient's primary care physician is Dr. Orvan Falconer.     Verlin Fester, P.A.                       Sharolyn Douglas, M.D.    CM/MEDQ  D:  02/15/2002  T:  02/15/2002  Job:  [id number]

## 2014-06-08 ENCOUNTER — Encounter (HOSPITAL_COMMUNITY): Payer: Self-pay

## 2014-06-08 ENCOUNTER — Emergency Department (HOSPITAL_COMMUNITY)
Admission: EM | Admit: 2014-06-08 | Discharge: 2014-06-09 | Disposition: A | Discharge status: Home / Self Care | Payer: Self-pay | Attending: Emergency Medicine | Admitting: Emergency Medicine

## 2014-06-08 DIAGNOSIS — G43009 Migraine without aura, not intractable, without status migrainosus: Secondary | ICD-10-CM | POA: Insufficient documentation

## 2014-06-08 DIAGNOSIS — R200 Anesthesia of skin: Secondary | ICD-10-CM | POA: Insufficient documentation

## 2014-06-08 DIAGNOSIS — Z720 Tobacco use: Secondary | ICD-10-CM | POA: Insufficient documentation

## 2014-06-08 DIAGNOSIS — I10 Essential (primary) hypertension: Secondary | ICD-10-CM | POA: Insufficient documentation

## 2014-06-08 HISTORY — DX: Essential (primary) hypertension: I10

## 2014-06-08 HISTORY — DX: Migraine, unspecified, not intractable, without status migrainosus: G43.909

## 2014-06-08 MED ORDER — DEXAMETHASONE SODIUM PHOSPHATE 4 MG/ML IJ SOLN
10.0000 mg | Freq: Once | INTRAMUSCULAR | Status: AC
  Administered 2014-06-08: 10 mg via INTRAVENOUS
  Filled 2014-06-08: qty 3

## 2014-06-08 MED ORDER — DIPHENHYDRAMINE HCL 50 MG/ML IJ SOLN
25.0000 mg | Freq: Once | INTRAMUSCULAR | Status: AC
  Administered 2014-06-08: 25 mg via INTRAVENOUS
  Filled 2014-06-08: qty 1

## 2014-06-08 MED ORDER — SODIUM CHLORIDE 0.9 % IV BOLUS (SEPSIS)
1000.0000 mL | Freq: Once | INTRAVENOUS | Status: AC
  Administered 2014-06-08: 1000 mL via INTRAVENOUS

## 2014-06-08 MED ORDER — METOCLOPRAMIDE HCL 5 MG/ML IJ SOLN
10.0000 mg | Freq: Once | INTRAMUSCULAR | Status: AC
  Administered 2014-06-08: 10 mg via INTRAVENOUS
  Filled 2014-06-08: qty 2

## 2014-06-08 NOTE — ED Provider Notes (Signed)
CSN: [id number]     Arrival date & time 06/08/14  2210 History   First MD Initiated Contact with Patient 06/08/14 2306    This chart was scribed for No att. providers found by Marica OtterNusrat Rahman, ED Scribe. This patient was seen in room [facility] and the patient's care was started at 11:14 PM.  Chief Complaint  Patient presents with  . Headache   HPI PCP: METZ,CHRISTINE HPI Comments: Leonides SakeLawanda B Kellam is a 39 y.o. female, with PMH noted below including migraines and daily tobacco use (1ppd), who presents to the Emergency Department complaining of left sided, pounding/aching, headache onset 5 days ago. She states the pain moves around but is always on the left side. Pt also complains of associated light sensitivity, sound sensitivity, tingling of the fingertips bilaterally, and nausea. Pt denies visual disturbances, numbness in UE or LE. Pt reports taking Excedrin Migraine at home with some relief, but only for an hour.  Has had these headaches in the past. Was put on BP meds about 1 year ago and this is the first HA she has had in the past year. .  Pt daily meds are: Propranolol and lisinopril.   [facility]  Past Medical History  Diagnosis Date  . Migraines   . Hypertension    Past Surgical History  Procedure Laterality Date  . Abdominal hysterectomy    . Cholecystectomy    . Back surgery    . Knee surgery     No family history on file. History  Substance Use Topics  . Smoking status: Current Every Day Smoker -- 1.00 packs/day    Types: Cigarettes  . Smokeless tobacco: Not on file  . Alcohol Use: No  employed Lives with spouse  OB History    No data available     Review of Systems  Constitutional: Negative for fever and chills.  Eyes: Positive for photophobia. Negative for visual disturbance.  Gastrointestinal: Positive for nausea. Negative for vomiting.  Neurological: Positive for numbness (tingling to fingertips bilaterally) and headaches. Negative for  speech difficulty.  Psychiatric/Behavioral: Negative for confusion.  All other systems reviewed and are negative.  Allergies  Ibuprofen  Home Medications   Prior to Admission medications   Not on File  propranolol lisinopril Triage Vitals: BP 140/81 mmHg  Pulse 64  Temp(Src) 99 F (37.2 C) (Oral)  Resp 18  Ht 5\' 4"  (1.626 m)  Wt 220 lb (99.791 kg)  BMI 37.74 kg/m2  SpO2 100%  Vital signs normal   Physical Exam  Constitutional: She is oriented to person, place, and time. She appears well-developed and well-nourished.  Non-toxic appearance. She does not appear ill. She appears distressed.  Sitting in a dark room  HENT:  Head: Normocephalic and atraumatic.  Right Ear: External ear normal.  Left Ear: External ear normal.  Nose: Nose normal. No mucosal edema or rhinorrhea.  Mouth/Throat: Oropharynx is clear and moist and mucous membranes are normal. No dental abscesses or uvula swelling.  Eyes: Conjunctivae and EOM are normal. Pupils are equal, round, and reactive to light.  Neck: Normal range of motion and full passive range of motion without pain. Neck supple.  Cardiovascular: Normal rate, regular rhythm and normal heart sounds.  Exam reveals no gallop and no friction rub.   No murmur heard. Pulmonary/Chest: Effort normal and breath sounds normal. No respiratory distress. She has no wheezes. She has no rhonchi. She has no rales. She exhibits no tenderness and no crepitus.  Abdominal: Soft. Normal  appearance and bowel sounds are normal. She exhibits no distension. There is no tenderness. There is no rebound and no guarding.  Musculoskeletal: Normal range of motion. She exhibits no edema or tenderness.  Moves all extremities well.   Neurological: She is alert and oriented to person, place, and time. She has normal strength. No cranial nerve deficit.  Skin: Skin is warm, dry and intact. No rash noted. No erythema. No pallor.  Psychiatric: Her speech is normal and behavior is  normal. Her mood appears not anxious.  Flat affect  Nursing note and vitals reviewed.   ED Course  Procedures (including critical care time)  Medications  sodium chloride 0.9 % bolus 1,000 mL (0 mLs Intravenous Stopped 06/09/14 0040)  metoCLOPramide (REGLAN) injection 10 mg (10 mg Intravenous Given 06/08/14 2337)  diphenhydrAMINE (BENADRYL) injection 25 mg (25 mg Intravenous Given 06/08/14 2338)  dexamethasone (DECADRON) injection 10 mg (10 mg Intravenous Given 06/08/14 2340)    DIAGNOSTIC STUDIES: Oxygen Saturation is 100% on RA, nl by my interpretation.    COORDINATION OF CARE: 11:17 PM-Discussed treatment plan which includes meds with pt at bedside and pt agreed to plan which is a migraine cocktail.    12:24 AM: Recheck-- Pt resting comfortably and reports her headache is almost gone, she does not need any more meds, and is ready to go home.   Labs Review Labs Reviewed - No data to display  Imaging Review No results found.   EKG Interpretation None      MDM   Final diagnoses:  Migraine without aura and without status migrainosus, not intractable    Plan discharge  Devoria Albe, MD, FACEP   I personally performed the services described in this documentation, which was scribed in my presence. The recorded information has been reviewed and considered.  Devoria Albe, MD, Concha Pyo, MD 06/09/14 [phone]

## 2014-06-08 NOTE — ED Notes (Signed)
Patient c/o headache X5 days. C/o nausea, photophobia and sensitivity to light. Patient states she has not had a headache like this for a year

## 2014-06-08 NOTE — ED Notes (Signed)
Patient states that she is nauseated.

## 2014-06-08 NOTE — ED Notes (Signed)
Patient ambulatory to restroom  ?

## 2014-06-08 NOTE — ED Notes (Signed)
My head is hurting, started hurting on Monday per pt. History of migraines per pt.

## 2014-06-09 NOTE — Discharge Instructions (Signed)
Go home and rest. Recheck as needed.  ° ° °Recurrent Migraine Headache °A migraine headache is an intense, throbbing pain on one or both sides of your head. Recurrent migraines keep coming back. A migraine can last for 30 minutes to several hours. °CAUSES  °The exact cause of a migraine headache is not always known. However, a migraine may be caused when nerves in the brain become irritated and release chemicals that cause inflammation. This causes pain. °Certain things may also trigger migraines, such as:  °· Alcohol. °· Smoking. °· Stress. °· Menstruation. °· Aged cheeses. °· Foods or drinks that contain nitrates, glutamate, aspartame, or tyramine. °· Lack of sleep. °· Chocolate. °· Caffeine. °· Hunger. °· Physical exertion. °· Fatigue. °· Medicines used to treat chest pain (nitroglycerine), birth control pills, estrogen, and some blood pressure medicines. °SYMPTOMS  °· Pain on one or both sides of your head. °· Pulsating or throbbing pain. °· Severe pain that prevents daily activities. °· Pain that is aggravated by any physical activity. °· Nausea, vomiting, or both. °· Dizziness. °· Pain with exposure to bright lights, loud noises, or activity. °· General sensitivity to bright lights, loud noises, or smells. °Before you get a migraine, you may get warning signs that a migraine is coming (aura). An aura may include: °· Seeing flashing lights. °· Seeing bright spots, halos, or zigzag lines. °· Having tunnel vision or blurred vision. °· Having feelings of numbness or tingling. °· Having trouble talking. °· Having muscle weakness. °DIAGNOSIS  °A recurrent migraine headache is often diagnosed based on: °· Symptoms. °· Physical examination. °· A CT scan or MRI of your head. These imaging tests cannot diagnose migraines but can help rule out other causes of headaches.   °TREATMENT  °Medicines may be given for pain and nausea. Medicines can also be given to help prevent recurrent migraines. °HOME CARE  INSTRUCTIONS °· Only take over-the-counter or prescription medicines for pain or discomfort as directed by your health care provider. The use of long-term narcotics is not recommended. °· Lie down in a dark, quiet room when you have a migraine. °· Keep a journal to find out what may trigger your migraine headaches. For example, write down: °¨ What you eat and drink. °¨ How much sleep you get. °¨ Any change to your diet or medicines. °· Limit alcohol consumption. °· Quit smoking if you smoke. °· Get 7-9 hours of sleep, or as recommended by your health care provider. °· Limit stress. °· Keep lights dim if bright lights bother you and make your migraines worse. °SEEK MEDICAL CARE IF:  °· You do not get relief from the medicines given to you. °· You have a recurrence of pain. °· You have a fever. °SEEK IMMEDIATE MEDICAL CARE IF: °· Your migraine becomes severe. °· You have a stiff neck. °· You have loss of vision. °· You have muscular weakness or loss of muscle control. °· You start losing your balance or have trouble walking. °· You feel faint or pass out. °· You have severe symptoms that are different from your first symptoms. °MAKE SURE YOU:  °· Understand these instructions. °· Will watch your condition. °· Will get help right away if you are not doing well or get worse. °Document Released: 09/23/2000 Document Revised: 05/15/2013 Document Reviewed: 09/05/2012 °[employer]® Patient Information ©2015 [employer]. This information is not intended to replace advice given to you by your health care provider. Make sure you discuss any questions you have with your health care   provider. ° °

## 2014-06-09 NOTE — ED Notes (Signed)
Patient verbalizes understanding of discharge instructions, home care and follow up care. Patient ambulatory out of department at this time with family. 

## 2015-06-12 ENCOUNTER — Emergency Department (HOSPITAL_COMMUNITY): Payer: Self-pay

## 2015-06-12 ENCOUNTER — Emergency Department (HOSPITAL_COMMUNITY)
Admission: EM | Admit: 2015-06-12 | Discharge: 2015-06-12 | Disposition: A | Discharge status: Home / Self Care | Payer: Self-pay | Attending: Emergency Medicine | Admitting: Emergency Medicine

## 2015-06-12 ENCOUNTER — Encounter (HOSPITAL_COMMUNITY): Payer: Self-pay

## 2015-06-12 DIAGNOSIS — F17210 Nicotine dependence, cigarettes, uncomplicated: Secondary | ICD-10-CM | POA: Insufficient documentation

## 2015-06-12 DIAGNOSIS — R0789 Other chest pain: Secondary | ICD-10-CM | POA: Insufficient documentation

## 2015-06-12 DIAGNOSIS — T7840XA Allergy, unspecified, initial encounter: Secondary | ICD-10-CM | POA: Insufficient documentation

## 2015-06-12 DIAGNOSIS — R0981 Nasal congestion: Secondary | ICD-10-CM | POA: Insufficient documentation

## 2015-06-12 DIAGNOSIS — I10 Essential (primary) hypertension: Secondary | ICD-10-CM | POA: Insufficient documentation

## 2015-06-12 DIAGNOSIS — R05 Cough: Secondary | ICD-10-CM | POA: Insufficient documentation

## 2015-06-12 LAB — TROPONIN I
Troponin I: <0.03 ng/mL (ref <0.031)
Troponin I: <0.03 ng/mL (ref <0.031)

## 2015-06-12 LAB — CBC
HCT: 38.6 % (ref 36.0–46.0)
Hemoglobin: 12.8 g/dL (ref 12.0–15.0)
MCH: 31 pg (ref 26.0–34.0)
MCHC: 33.2 g/dL (ref 30.0–36.0)
MCV: 93.5 fL (ref 78.0–100.0)
PLATELETS: 239 10*3/uL (ref 150–400)
RBC: 4.13 MIL/uL (ref 3.87–5.11)
RDW: 14.9 % (ref 11.5–15.5)
WBC: 6.9 10*3/uL (ref 4.0–10.5)

## 2015-06-12 LAB — BASIC METABOLIC PANEL
ANION GAP: 8 (ref 5–15)
BUN: 9 mg/dL (ref 6–20)
CALCIUM: 9 mg/dL (ref 8.9–10.3)
CO2: 26 mmol/L (ref 22–32)
CREATININE: 0.76 mg/dL (ref 0.44–1.00)
Chloride: 100 mmol/L — ABNORMAL LOW (ref 101–111)
GLUCOSE: 82 mg/dL (ref 65–99)
Potassium: 3.8 mmol/L (ref 3.5–5.1)
Sodium: 134 mmol/L — ABNORMAL LOW (ref 135–145)

## 2015-06-12 NOTE — ED Notes (Signed)
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  Pt says thinks she may be having an allergic reaction.  Pt says has had this type of reaction to cats before but she doesn't know if the pt had any cats or not.  Pt took a benadryl 25 at 1145.  Pt says it has helped her symptoms " a little bit."  VS stable, NAD noted.

## 2015-06-12 NOTE — ED Provider Notes (Signed)
CSN: [id number]     Arrival date & time 06/12/15  1255 History   First MD Initiated Contact with Patient 06/12/15 1322     Chief Complaint  Patient presents with  . 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 Patient had gradually worsening symptoms that have improved since she left the house. No radiation to the chest pain. No cardiac history known. No recent surgeries no unilateral leg swelling no blood thinners. Currently no chest pain or shortness of breath. Patient took Benadryl at 60  Patient is a 40 y.o. female presenting with allergic reaction. The history is provided by the patient.  Allergic Reaction Presenting symptoms: no rash     Past Medical History  Diagnosis Date  . Migraines   . Hypertension    Past Surgical History  Procedure Laterality Date  . Abdominal hysterectomy    . Cholecystectomy    . Back surgery    . Knee surgery     No family history on file. Social History  Substance Use Topics  . Smoking status: Current Every Day Smoker -- 1.00 packs/day    Types: Cigarettes  . Smokeless tobacco: None  . Alcohol Use: No   OB History    No data available     Review of Systems  Constitutional: Negative for fever and chills.  HENT: Positive for congestion.   Eyes: Negative for visual disturbance.  Respiratory: Positive for cough and chest tightness. Negative for shortness of breath.   Cardiovascular: Negative for chest pain.  Gastrointestinal: Negative for vomiting and abdominal pain.  Genitourinary: Negative for dysuria and flank pain.  Musculoskeletal: Negative for back pain, neck pain and neck stiffness.  Skin: Negative for rash.   Neurological: Negative for light-headedness and headaches.      Allergies  Ibuprofen and Other  Home Medications   Prior to Admission medications   Not on File   BP 141/87 mmHg  Pulse 86  Temp(Src) 97.8 F (36.6 C) (Oral)  Resp 18  Ht  (1.626 m)  Wt 200 lb (90.719 kg)  BMI 34.31 kg/m2  SpO2 100% Physical Exam  Constitutional: She is oriented to person, place, and time. She appears well-developed and well-nourished.  HENT:  Head: Normocephalic and atraumatic.  No angioedema  Eyes: Conjunctivae are normal. Right eye exhibits no discharge. Left eye exhibits no discharge.  Neck: Normal range of motion. Neck supple. No tracheal deviation present.  Cardiovascular: Normal rate, regular rhythm and intact distal pulses.   No murmur heard. Pulmonary/Chest: Effort normal and breath sounds normal.  Abdominal: Soft. She exhibits no distension. There is no tenderness. There is no guarding.  Musculoskeletal: She exhibits no edema.  Neurological: She is alert and oriented to person, place, and time.  Skin: Skin is warm. No rash noted.  Psychiatric: She has a normal mood and affect.  Nursing note and vitals reviewed.   ED Course  Procedures (including critical care time) Labs Review Labs Reviewed  BASIC METABOLIC PANEL - Abnormal; Notable for the following:    Sodium 134 (*)    Chloride 100 (*)    All other components within normal limits  CBC  TROPONIN I  TROPONIN I    Imaging Review Dg Chest 2 View  06/12/2015  CLINICAL DATA:  Sudden  onset of chest tightness. EXAM: CHEST  2 VIEW COMPARISON:  11/15/2009 FINDINGS: Both lungs are clear. No evidence for airspace disease or pulmonary edema. Heart and mediastinum are within normal limits. Trachea is midline. No large pleural effusions. Surgical clips in the upper abdomen. IMPRESSION: No active cardiopulmonary disease. Electronically Signed   By: Richarda OverlieAdam  Henn M.D.   On: 06/12/2015 13:38   I have personally reviewed and  evaluated these images and lab results as part of my medical decision-making.   EKG Interpretation   Date/Time:  Wednesday Jun 12 2015 13:17:04 EDT Ventricular Rate:  77 PR Interval:  144 QRS Duration: 82 QT Interval:  398 QTC Calculation: 450 R Axis:   36 Text Interpretation:  Normal sinus rhythm Possible Left atrial enlargement  Nonspecific T wave abnormality Abnormal ECG Confirmed by Jodi MourningZAVITZ MD, Anarie Kalish  [phone]([id number]) on 06/12/2015 1:22:57 PM      MDM   Final diagnoses:  Allergic reaction, initial encounter  Chest tightness   Clinically concern for mild allergic reaction. No angioedema on exam, no chest pain or shortness of breath. Patient feels well currently. Patient had cardiac screen in the ER nonspecific T wave flattening no old EKG to compare. Plan for delta troponin and if negative close outpatient follow-up. Strict reasons to return given to patient. Patient low risk for cardiac based on history.  Reviewed EKG nonspecific T-wave flattening no old EKG to compare to. Reassess the patient and discussed no old EKG. Patient agreed to stay for second troponin to ensure no signs of cardiac event from chest tightness earlier today. Patient will follow-up with primary doctor if that is negative.  Results and differential diagnosis were discussed with the patient/parent/guardian. Xrays were independently reviewed by myself.  Close follow up outpatient was discussed, comfortable with the plan.   Medications - No data to display  Filed Vitals:   06/12/15 1310  BP: 141/87  Pulse: 86  Temp: 97.8 F (36.6 C)  TempSrc: Oral  Resp: 18  Height: 5\' 4"  (1.626 m)  Weight: 200 lb (90.719 kg)  SpO2: 100%    Final diagnoses:  Allergic reaction, initial encounter  Chest tightness       Blane OharaJoshua Andree Golphin, MD 06/12/15 1520

## 2015-06-12 NOTE — Discharge Instructions (Signed)
If you were given medicines take as directed.  If you are on coumadin or contraceptives realize their levels and effectiveness is altered by many different medicines.  If you have any reaction (rash, tongues swelling, other) to the medicines stop taking and see a physician.    If your blood pressure was elevated in the ER make sure you follow up for management with a primary doctor or return for chest pain, shortness of breath or stroke symptoms.  Please follow up as directed and return to the ER or see a physician for new or worsening symptoms.  Thank you. Filed Vitals:   06/12/15 1310  BP: 141/87  Pulse: 86  Temp: 97.8 F (36.6 C)  TempSrc: Oral  Resp: 18  Height: 5\' 4"  (1.626 m)  Weight: 200 lb (90.719 kg)  SpO2: 100%

## 2018-05-01 IMAGING — DX DG CHEST 2V
2 series · 2 of 2 positions shown · non-contrast
Comparison: 11/15/2009

CLINICAL DATA: Sudden onset of chest tightness.

EXAM:
CHEST  2 VIEW

[chest pa]
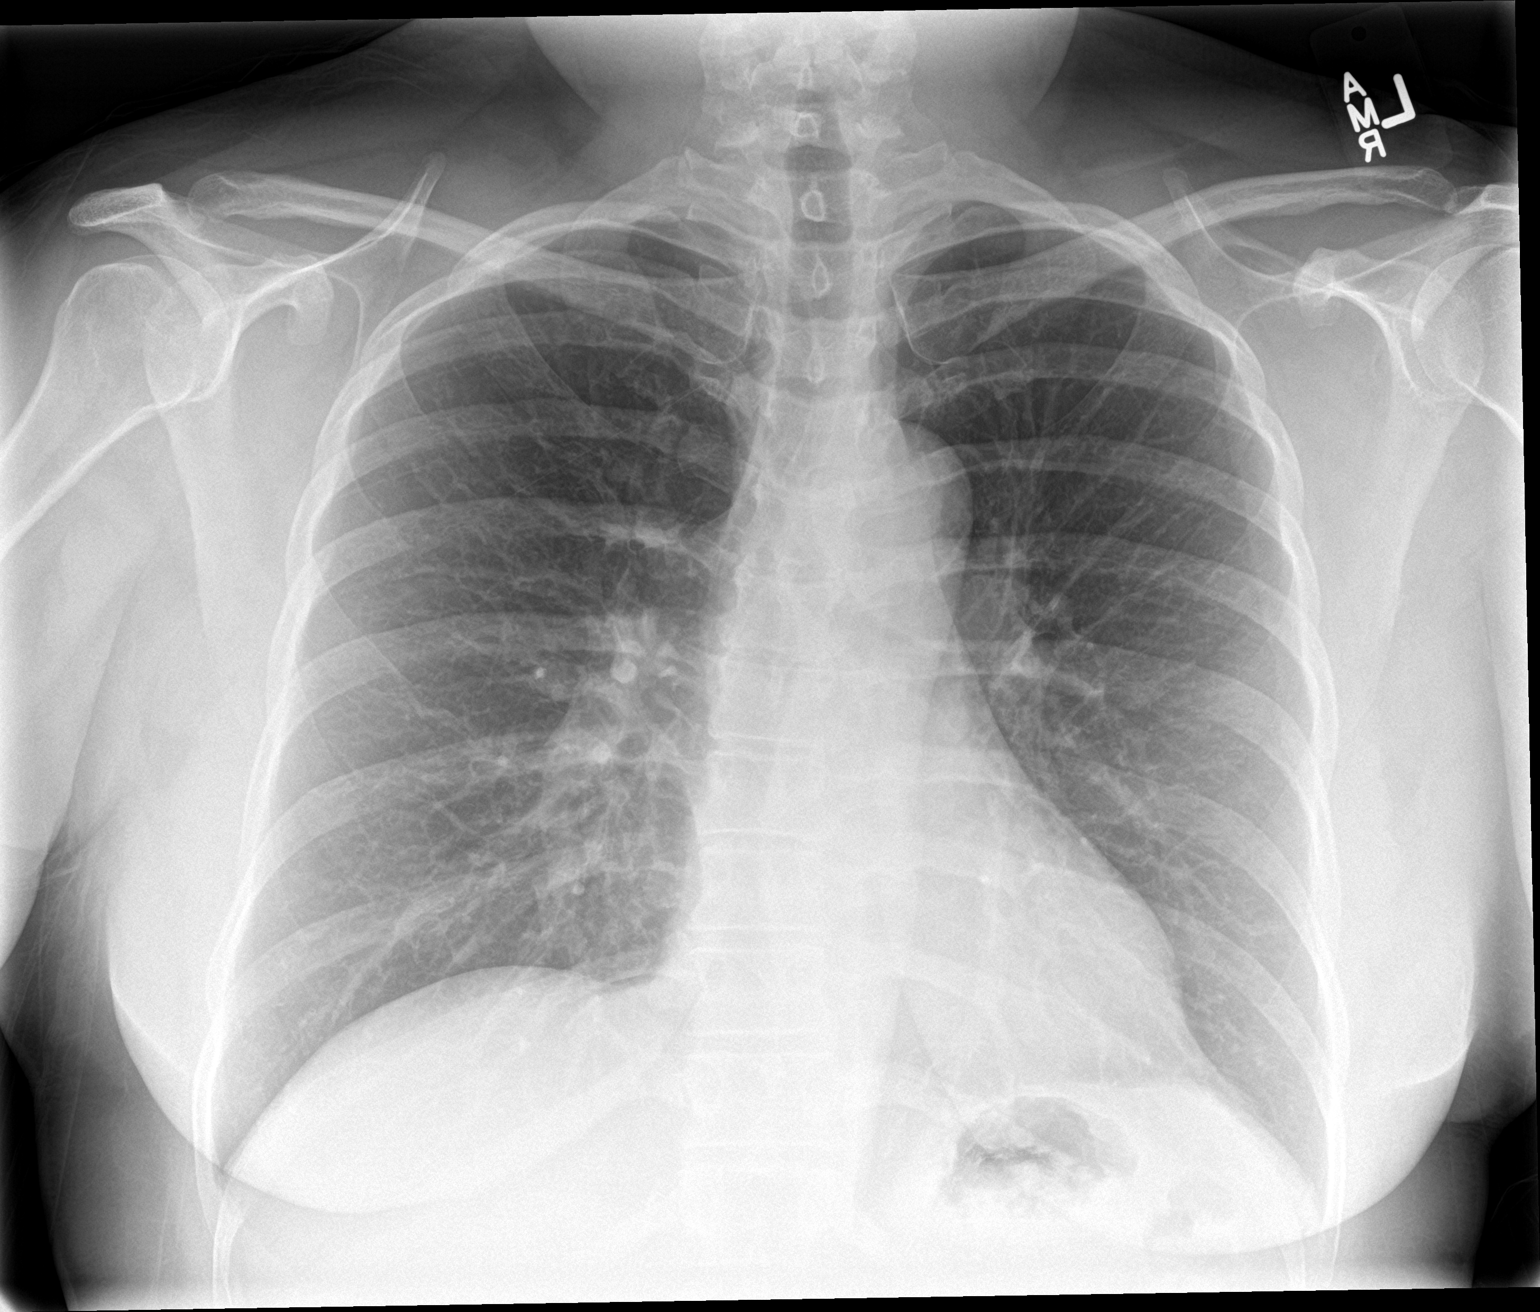

[chest lat]
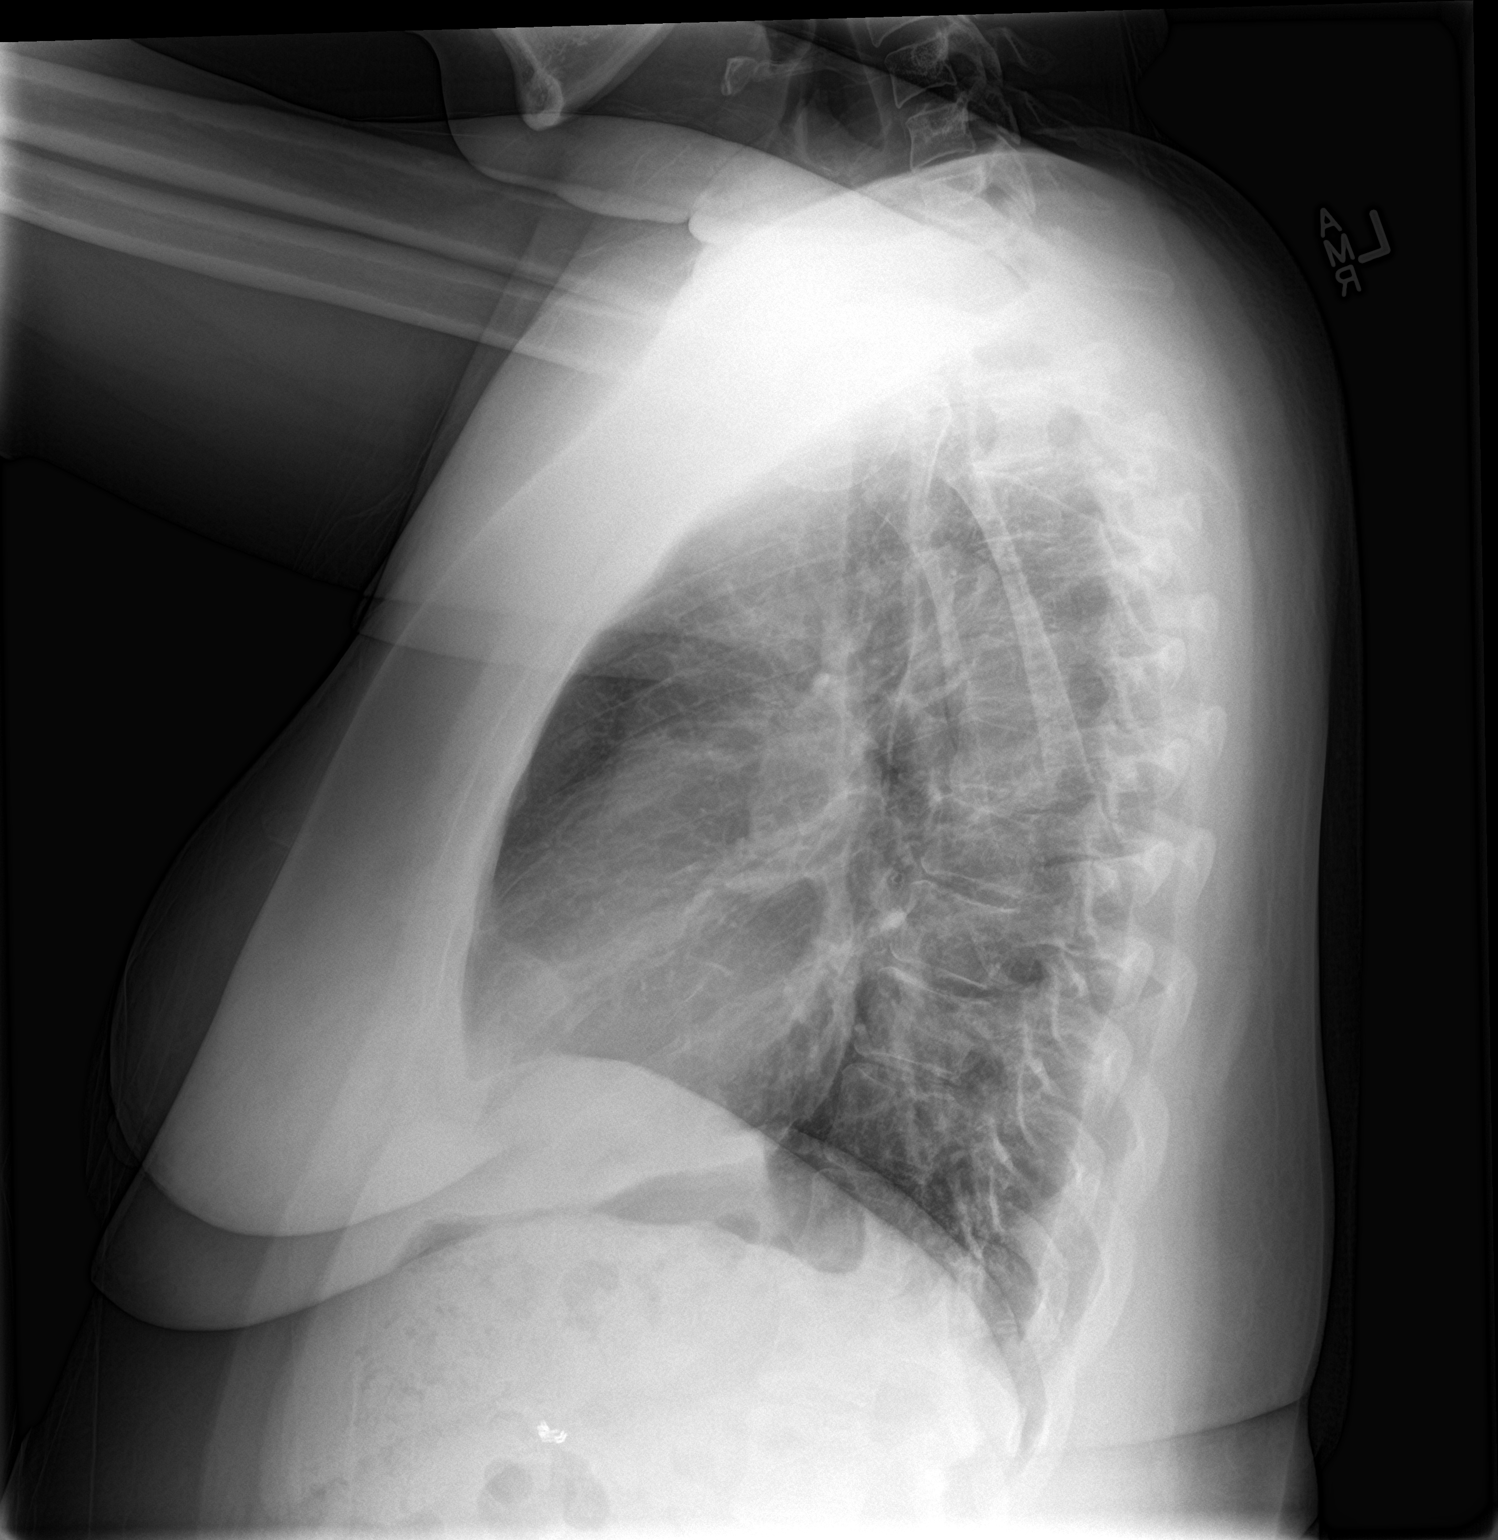

[2 of 2 positions shown; findings below may reference images not displayed]

FINDINGS: Both lungs are clear. No evidence for airspace disease or pulmonary
edema. Heart and mediastinum are within normal limits. Trachea is
midline. No large pleural effusions. Surgical clips in the upper
abdomen.
IMPRESSION: No active cardiopulmonary disease.
